# Patient Record
Sex: Female | Born: 2000 | Race: White | Hispanic: No | Marital: Single
Health system: Southern US, Community
[De-identification: ages and names within clinical notes are randomized; demographics above are authoritative.]

## PROBLEM LIST (undated history)

## (undated) ENCOUNTER — Emergency Department (HOSPITAL_COMMUNITY): Admission: EM | Payer: Medicaid Other | Source: Home / Self Care

## (undated) DIAGNOSIS — F1310 Sedative, hypnotic or anxiolytic abuse, uncomplicated: Secondary | ICD-10-CM

## (undated) DIAGNOSIS — F1210 Cannabis abuse, uncomplicated: Secondary | ICD-10-CM

## (undated) DIAGNOSIS — J302 Other seasonal allergic rhinitis: Secondary | ICD-10-CM

## (undated) DIAGNOSIS — R519 Headache, unspecified: Secondary | ICD-10-CM

## (undated) DIAGNOSIS — K219 Gastro-esophageal reflux disease without esophagitis: Secondary | ICD-10-CM

## (undated) DIAGNOSIS — G43909 Migraine, unspecified, not intractable, without status migrainosus: Secondary | ICD-10-CM

## (undated) DIAGNOSIS — R51 Headache: Secondary | ICD-10-CM

## (undated) DIAGNOSIS — M419 Scoliosis, unspecified: Secondary | ICD-10-CM

## (undated) HISTORY — DX: Headache, unspecified: R51.9

## (undated) HISTORY — DX: Headache: R51

---
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
